# Patient Record
Sex: Female | Born: 2005 | Race: White | Hispanic: No | Marital: Single | Smoking: Never smoker
Health system: Southern US, Community
[De-identification: ages and names within clinical notes are randomized; demographics above are authoritative.]

## PROBLEM LIST (undated history)

## (undated) DIAGNOSIS — G43909 Migraine, unspecified, not intractable, without status migrainosus: Secondary | ICD-10-CM

## (undated) DIAGNOSIS — R109 Unspecified abdominal pain: Secondary | ICD-10-CM

## (undated) DIAGNOSIS — D649 Anemia, unspecified: Secondary | ICD-10-CM

## (undated) DIAGNOSIS — F419 Anxiety disorder, unspecified: Secondary | ICD-10-CM

## (undated) HISTORY — DX: Anemia, unspecified: D64.9

## (undated) HISTORY — DX: Anxiety disorder, unspecified: F41.9

## (undated) HISTORY — PX: TYMPANOPLASTY: SHX33

## (undated) HISTORY — DX: Migraine, unspecified, not intractable, without status migrainosus: G43.909

## (undated) HISTORY — DX: Unspecified abdominal pain: R10.9

---
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
## 2023-04-07 ENCOUNTER — Other Ambulatory Visit: Payer: Self-pay | Admitting: Adult Health

## 2023-08-18 ENCOUNTER — Other Ambulatory Visit: Payer: Self-pay | Admitting: Adult Health

## 2023-12-14 ENCOUNTER — Ambulatory Visit: Admitting: Adult Health

## 2023-12-14 ENCOUNTER — Encounter: Payer: Self-pay | Admitting: Adult Health

## 2023-12-14 VITALS — BP 121/81 | HR 108 | Ht 63.0 in | Wt 128.5 lb

## 2023-12-14 DIAGNOSIS — Z113 Encounter for screening for infections with a predominantly sexual mode of transmission: Secondary | ICD-10-CM

## 2023-12-14 DIAGNOSIS — Z7689 Persons encountering health services in other specified circumstances: Secondary | ICD-10-CM

## 2023-12-14 MED ORDER — LO LOESTRIN FE 1 MG-10 MCG / 10 MCG PO TABS: 1.0000 | ORAL_TABLET | Freq: Every day | ORAL | 4 refills | Status: AC

## 2023-12-14 NOTE — Progress Notes (Signed)
  Subjective:     Patient ID: Barbara Morris, female   DOB: 05/14/2006, 18 y.o.   MRN: [id number]  HPI Barbara Morris is a 18 year old white female,single, G0P0, in to get refills on Lo Loestrin , she is happy with them. She is student at [employer].  PCP is Maximino Spanner NP  Review of Systems Periods are good, Patient denies any daily headaches(has  headaches occasionally), hearing loss, fatigue, blurred vision, shortness of breath, abdominal pain, problems with bowel movements, urination, or intercourse.(Has not had sex). No joint pain or mood swings.    Has had chest pain past, though it was due to vaping, which she has not stopped yet.  Objective:   Physical Exam BP 121/81 (BP Location: Left Arm, Patient Position: Sitting, Cuff Size: Normal)   Pulse (!) 108   Ht 5\' 3"  (1.6 m)   Wt 128 lb 8 oz (58.3 kg)   LMP 11/17/2023 (Approximate)   BMI 22.76 kg/m     Skin warm and dry. Lungs: clear to ausculation bilaterally. Cardiovascular: regular rate and rhythm.  Fall risk is low  Upstream - 12/14/23 1404       Pregnancy Intention Screening   Does the patient want to become pregnant in the next year? No    Does the patient's partner want to become pregnant in the next year? No    Would the patient like to discuss contraceptive options today? No      Contraception Wrap Up   Current Method Abstinence;Oral Contraceptive    End Method Abstinence;Oral Contraceptive    Contraception Counseling Provided Yes             Assessment:     1. Screening examination for STD (sexually transmitted disease) Urine sent for GC/CHL - GC/Chlamydia Probe Amp  2. Encounter for menstrual regulation (Primary) Happy with lo Loestrin , will refill Meds ordered this encounter  Medications   Norethindrone-Ethinyl Estradiol-Fe Biphas (LO LOESTRIN FE ) 1 MG-10 MCG / 10 MCG tablet    Sig: Take 1 tablet by mouth daily.    Dispense:  84 tablet    Refill:  4    Supervising Provider:   Wendelyn Halter [[id number]]       Plan:      Follow up in 1 year Pap at 23, if not having sex

## 2023-12-16 ENCOUNTER — Ambulatory Visit: Payer: Self-pay | Admitting: Adult Health

## 2023-12-16 LAB — GC/CHLAMYDIA PROBE AMP
Chlamydia trachomatis, NAA: NEGATIVE
Neisseria Gonorrhoeae by PCR: NEGATIVE

## 2023-12-16 NOTE — Telephone Encounter (Signed)
Pt aware GC/CHL are both negative. Pt voiced understanding. JSY 

## 2023-12-16 NOTE — Telephone Encounter (Signed)
-----   Message from Lendia Quay sent at 12/16/2023  8:45 AM EDT ----- Let her know both GC/CHL negative
